# Patient Record
Sex: Female | Born: 1995 | Race: White | Hispanic: No | Marital: Single | Smoking: Never smoker
Health system: Southern US, Community
[De-identification: ages and names within clinical notes are randomized; demographics above are authoritative.]

## PROBLEM LIST (undated history)

## (undated) DIAGNOSIS — F909 Attention-deficit hyperactivity disorder, unspecified type: Secondary | ICD-10-CM

## (undated) DIAGNOSIS — Z8669 Personal history of other diseases of the nervous system and sense organs: Secondary | ICD-10-CM

## (undated) DIAGNOSIS — Z803 Family history of malignant neoplasm of breast: Secondary | ICD-10-CM

## (undated) HISTORY — PX: WISDOM TOOTH EXTRACTION: SHX21

## (undated) HISTORY — DX: Family history of malignant neoplasm of breast: Z80.3

---

## 2017-10-01 DIAGNOSIS — H9203 Otalgia, bilateral: Secondary | ICD-10-CM | POA: Diagnosis present

## 2017-10-01 DIAGNOSIS — H6693 Otitis media, unspecified, bilateral: Secondary | ICD-10-CM | POA: Diagnosis not present

## 2017-10-02 ENCOUNTER — Emergency Department (HOSPITAL_COMMUNITY)
Admission: EM | Admit: 2017-10-02 | Discharge: 2017-10-02 | Disposition: A | Discharge status: Home / Self Care | Payer: BLUE CROSS/BLUE SHIELD | Attending: Emergency Medicine | Admitting: Emergency Medicine

## 2017-10-02 ENCOUNTER — Encounter (HOSPITAL_COMMUNITY): Payer: Self-pay | Admitting: Family Medicine

## 2017-10-02 DIAGNOSIS — H6690 Otitis media, unspecified, unspecified ear: Secondary | ICD-10-CM

## 2017-10-02 HISTORY — DX: Personal history of other diseases of the nervous system and sense organs: Z86.69

## 2017-10-02 MED ORDER — AMOXICILLIN-POT CLAVULANATE 875-125 MG PO TABS: 1.0000 | ORAL_TABLET | Freq: Two times a day (BID) | ORAL | 0 refills | Status: AC

## 2017-10-02 MED ORDER — IBUPROFEN 200 MG PO TABS
600.0000 mg | ORAL_TABLET | Freq: Once | ORAL | Status: AC
  Administered 2017-10-02: 600 mg via ORAL
  Filled 2017-10-02: qty 3

## 2017-10-02 NOTE — ED Provider Notes (Signed)
[facility]-EMERGENCY DEPT Provider Note   CSN: [id number] Arrival date & time: 10/01/17  2235     History   Chief Complaint Chief Complaint  Patient presents with  . Otalgia    HPI Kelly Goodman is a 22 y.o. female.  HPI 22 year old female with history of recurrent ear infections presents to the emergency department today with complaints of bilateral otalgia.  States that over the past week she has had an upper respiratory tract infection including sinus congestion, rhinorrhea, sore throat.  Patient reports that her other symptoms have improved however her otalgia has persisted.  Left greater than right.  She reports ear fullness and popping.  She denies any known fevers.  Does report history of ear infections does require antibiotics in the past.  As she is been taking over-the-counter Sudafed with only little relief.  Nothing makes her symptoms better or worse.  She took Tylenol prior to arrival but has not taken any Motrin.  Denies any other associated symptoms. Past Medical History:  Diagnosis Date  . History of ear infections     There are no active problems to display for this patient.   Past Surgical History:  Procedure Laterality Date  . WISDOM TOOTH EXTRACTION      OB History    No data available       Home Medications    Prior to Admission medications   Medication Sig Start Date End Date Taking? Authorizing Provider  amoxicillin-clavulanate (AUGMENTIN) 875-125 MG tablet Take 1 tablet by mouth every 12 (twelve) hours. 10/02/17   Rise MuLeaphart, Sekai Gitlin T, PA-C    Family History History reviewed. No pertinent family history.  Social History Social History   Tobacco Use  . Smoking status: Never Smoker  . Smokeless tobacco: Never Used  Substance Use Topics  . Alcohol use: Yes    Comment: 1-2 drinks a night   . Drug use: No     Allergies   Patient has no allergy information on record.   Review of Systems Review of Systems  All  other systems reviewed and are negative.    Physical Exam Updated Vital Signs BP 126/79 (BP Location: Left Arm)   Pulse 79   Temp 97.8 F (36.6 C) (Oral)   Resp 20   Ht 5\' 6"  (1.676 m)   Wt 68 kg (150 lb)   LMP 09/21/2017   SpO2 99%   BMI 24.21 kg/m   Physical Exam  Constitutional: She appears well-developed and well-nourished. No distress.  HENT:  Head: Normocephalic and atraumatic.  Right Ear: Hearing, external ear and ear canal normal. No mastoid tenderness. Tympanic membrane is erythematous and retracted. A middle ear effusion is present.  Left Ear: Hearing, external ear and ear canal normal. No mastoid tenderness. Tympanic membrane is erythematous and bulging. A middle ear effusion is present.  Nose: Nose normal.  Mouth/Throat: Oropharynx is clear and moist. No oropharyngeal exudate.  Eyes: Conjunctivae are normal. Right eye exhibits no discharge. Left eye exhibits no discharge. No scleral icterus.  Neck: Normal range of motion. Neck supple.  Pulmonary/Chest: No respiratory distress.  Musculoskeletal: Normal range of motion.  Lymphadenopathy:    She has no cervical adenopathy.  Neurological: She is alert.  Skin: No pallor.  Psychiatric: Her behavior is normal. Judgment and thought content normal.  Nursing note and vitals reviewed.    ED Treatments / Results  Labs (all labs ordered are listed, but only abnormal results are displayed) Labs Reviewed - No data to  display  EKG  EKG Interpretation None       Radiology No results found.  Procedures Procedures (including critical care time)  Medications Ordered in ED Medications  ibuprofen (ADVIL,MOTRIN) tablet 600 mg (not administered)     Initial Impression / Assessment and Plan / ED Course  I have reviewed the triage vital signs and the nursing notes.  Pertinent labs & imaging results that were available during my care of the patient were reviewed by me and considered in my medical decision making  (see chart for details).     Patient presents with otalgia and exam consistent with acute otitis media. No concern for acute mastoiditis, meningitis.  Patient started on Augmentin due to recurrent ear infections and physical exam.  Have given ENT follow-up if symptoms not improved. Pt is hemodynamically stable, in NAD, & able to ambulate in the ED. Evaluation does not show pathology that would require ongoing emergent intervention or inpatient treatment. I explained the diagnosis to the patient. Pain has been managed & has no complaints prior to dc. Pt is comfortable with above plan and is stable for discharge at this time. All questions were answered prior to disposition. Strict return precautions for f/u to the ED were discussed. Encouraged follow up with PCP.     Final Clinical Impressions(s) / ED Diagnoses   Final diagnoses:  Acute otitis media, unspecified otitis media type    ED Discharge Orders        Ordered    amoxicillin-clavulanate (AUGMENTIN) 875-125 MG tablet  Every 12 hours     10/02/17 0032       Rise Mu, PA-C 10/02/17 0037    Dione Booze, MD 10/02/17 [phone]

## 2017-10-02 NOTE — ED Notes (Signed)
Bed: WA07 Expected date:  Expected time:  Means of arrival:  Comments: 

## 2017-10-02 NOTE — Discharge Instructions (Signed)
You do have signs of an inner ear infection.  Take antibiotics as prescribed.  May use over-the-counter Flonase.  Motrin Tylenol for pain and fevers.  Have given you ENT follow-up if symptoms do not improve return the ED with any worsening symptoms.

## 2017-10-02 NOTE — ED Triage Notes (Signed)
Patient reports she had cold like symptoms last week. Over the weekend she developed ear aches but got worse tonight. Denies fever. Patient reports she has had multiple ear infections in the past.

## 2017-10-26 DIAGNOSIS — F909 Attention-deficit hyperactivity disorder, unspecified type: Secondary | ICD-10-CM | POA: Diagnosis not present

## 2018-01-15 DIAGNOSIS — K219 Gastro-esophageal reflux disease without esophagitis: Secondary | ICD-10-CM | POA: Diagnosis not present

## 2018-01-15 DIAGNOSIS — F900 Attention-deficit hyperactivity disorder, predominantly inattentive type: Secondary | ICD-10-CM | POA: Diagnosis not present

## 2018-01-15 DIAGNOSIS — Z9109 Other allergy status, other than to drugs and biological substances: Secondary | ICD-10-CM | POA: Diagnosis not present

## 2018-01-15 DIAGNOSIS — Z3009 Encounter for other general counseling and advice on contraception: Secondary | ICD-10-CM | POA: Diagnosis not present

## 2018-03-07 DIAGNOSIS — Z23 Encounter for immunization: Secondary | ICD-10-CM | POA: Diagnosis not present

## 2018-08-05 DIAGNOSIS — Z0000 Encounter for general adult medical examination without abnormal findings: Secondary | ICD-10-CM | POA: Diagnosis not present

## 2018-08-05 DIAGNOSIS — F900 Attention-deficit hyperactivity disorder, predominantly inattentive type: Secondary | ICD-10-CM | POA: Diagnosis not present

## 2018-08-05 DIAGNOSIS — Z13220 Encounter for screening for lipoid disorders: Secondary | ICD-10-CM | POA: Diagnosis not present

## 2018-09-17 DIAGNOSIS — H5789 Other specified disorders of eye and adnexa: Secondary | ICD-10-CM | POA: Diagnosis not present

## 2019-02-03 DIAGNOSIS — F900 Attention-deficit hyperactivity disorder, predominantly inattentive type: Secondary | ICD-10-CM | POA: Diagnosis not present

## 2019-03-13 DIAGNOSIS — Z01419 Encounter for gynecological examination (general) (routine) without abnormal findings: Secondary | ICD-10-CM | POA: Diagnosis not present

## 2019-03-14 ENCOUNTER — Other Ambulatory Visit: Payer: Self-pay | Admitting: Obstetrics & Gynecology

## 2019-03-14 DIAGNOSIS — N63 Unspecified lump in unspecified breast: Secondary | ICD-10-CM

## 2019-03-21 ENCOUNTER — Ambulatory Visit
Admission: RE | Admit: 2019-03-21 | Discharge: 2019-03-21 | Disposition: A | Discharge status: Home / Self Care | Payer: BC Managed Care – PPO | Source: Ambulatory Visit | Attending: Obstetrics & Gynecology | Admitting: Obstetrics & Gynecology

## 2019-03-21 ENCOUNTER — Other Ambulatory Visit: Payer: Self-pay

## 2019-03-21 DIAGNOSIS — N6489 Other specified disorders of breast: Secondary | ICD-10-CM | POA: Diagnosis not present

## 2019-03-21 DIAGNOSIS — N63 Unspecified lump in unspecified breast: Secondary | ICD-10-CM

## 2019-03-24 DIAGNOSIS — Z20828 Contact with and (suspected) exposure to other viral communicable diseases: Secondary | ICD-10-CM | POA: Diagnosis not present

## 2019-03-25 DIAGNOSIS — L0390 Cellulitis, unspecified: Secondary | ICD-10-CM | POA: Diagnosis not present

## 2019-03-25 DIAGNOSIS — T63461A Toxic effect of venom of wasps, accidental (unintentional), initial encounter: Secondary | ICD-10-CM | POA: Diagnosis not present

## 2019-03-26 DIAGNOSIS — L0390 Cellulitis, unspecified: Secondary | ICD-10-CM | POA: Diagnosis not present

## 2019-03-26 DIAGNOSIS — T63461D Toxic effect of venom of wasps, accidental (unintentional), subsequent encounter: Secondary | ICD-10-CM | POA: Diagnosis not present

## 2019-05-26 DIAGNOSIS — Z20828 Contact with and (suspected) exposure to other viral communicable diseases: Secondary | ICD-10-CM | POA: Diagnosis not present

## 2019-06-09 DIAGNOSIS — Z20828 Contact with and (suspected) exposure to other viral communicable diseases: Secondary | ICD-10-CM | POA: Diagnosis not present

## 2019-08-12 DIAGNOSIS — F900 Attention-deficit hyperactivity disorder, predominantly inattentive type: Secondary | ICD-10-CM | POA: Diagnosis not present

## 2019-08-12 DIAGNOSIS — Z0000 Encounter for general adult medical examination without abnormal findings: Secondary | ICD-10-CM | POA: Diagnosis not present

## 2019-08-12 DIAGNOSIS — R739 Hyperglycemia, unspecified: Secondary | ICD-10-CM | POA: Diagnosis not present

## 2019-08-21 DIAGNOSIS — Z0000 Encounter for general adult medical examination without abnormal findings: Secondary | ICD-10-CM | POA: Diagnosis not present

## 2019-08-21 DIAGNOSIS — R739 Hyperglycemia, unspecified: Secondary | ICD-10-CM | POA: Diagnosis not present

## 2019-08-21 DIAGNOSIS — Z13220 Encounter for screening for lipoid disorders: Secondary | ICD-10-CM | POA: Diagnosis not present

## 2019-09-30 DIAGNOSIS — Z20828 Contact with and (suspected) exposure to other viral communicable diseases: Secondary | ICD-10-CM | POA: Diagnosis not present

## 2019-10-30 DIAGNOSIS — Z23 Encounter for immunization: Secondary | ICD-10-CM | POA: Diagnosis not present

## 2020-01-21 DIAGNOSIS — F900 Attention-deficit hyperactivity disorder, predominantly inattentive type: Secondary | ICD-10-CM | POA: Diagnosis not present

## 2020-01-21 DIAGNOSIS — T63461A Toxic effect of venom of wasps, accidental (unintentional), initial encounter: Secondary | ICD-10-CM | POA: Diagnosis not present

## 2020-03-05 DIAGNOSIS — Z20822 Contact with and (suspected) exposure to covid-19: Secondary | ICD-10-CM | POA: Diagnosis not present

## 2020-03-07 IMAGING — US ULTRASOUND RIGHT BREAST LIMITED
1 series · 2 of 2 positions shown · non-contrast
Comparison: None.

CLINICAL DATA: Patient's referring clinician reports a palpable
lump in the upper right breast. Patient's mother was diagnosed with
breast carcinoma around age 40.

EXAM:
ULTRASOUND OF THE RIGHT BREAST

[Series 1: ultrasound right breast limited · 0.07mm/px · 2 of 2 slices shown]
[im 1/2]
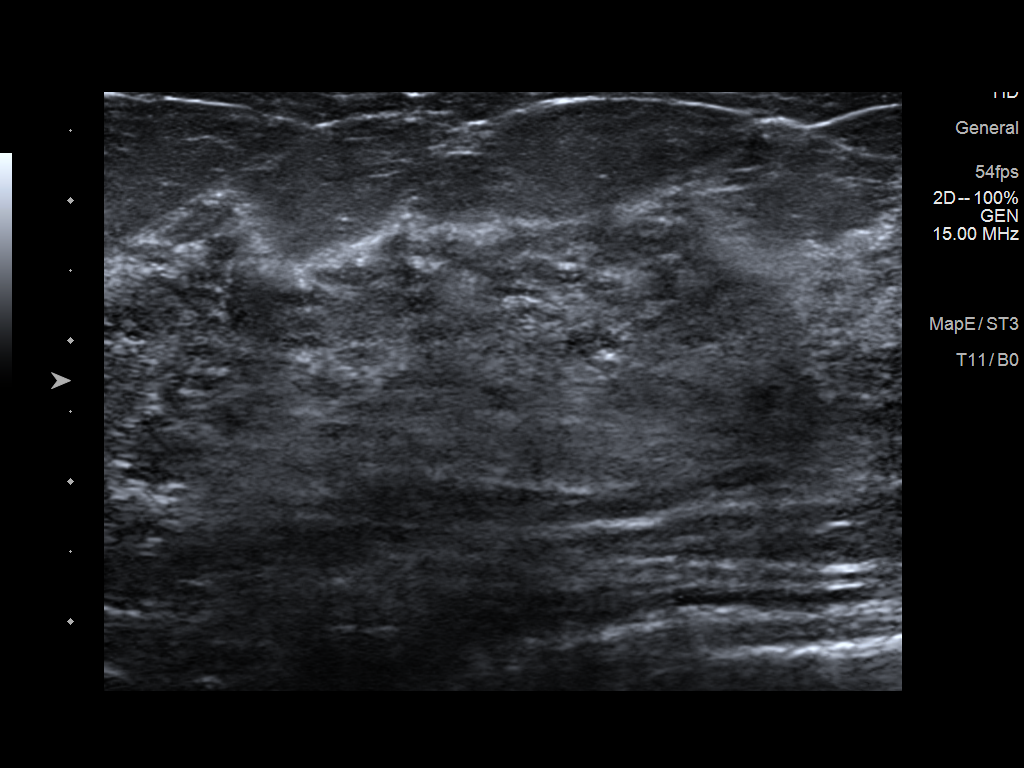
[im 2/2]
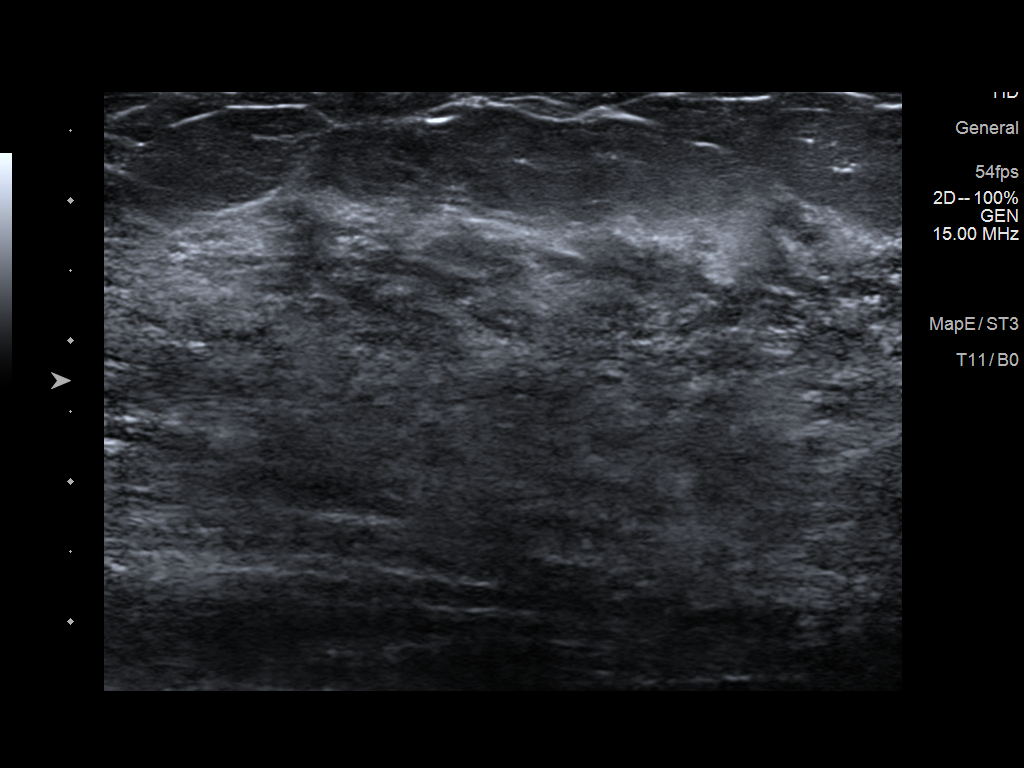

[2 of 2 positions shown; findings below may reference images not displayed]

FINDINGS: On physical exam, no mass is palpated in the upper right breast.

Targeted ultrasound is performed, showing heterogeneous
fibroglandular tissue throughout the upper right breast. No mass or
suspicious lesion.
IMPRESSION: Negative exam.  No evidence of breast malignancy.

RECOMMENDATION:
1. Recommend screening mammography beginning 10 years prior to the
age at which the patient's mother was diagnosed with breast
carcinoma.

I have discussed the findings and recommendations with the patient.
Results were also provided in writing at the conclusion of the
visit. If applicable, a reminder letter will be sent to the patient
regarding the next appointment.

BI-RADS CATEGORY  1: Negative.

## 2020-03-19 DIAGNOSIS — Z01419 Encounter for gynecological examination (general) (routine) without abnormal findings: Secondary | ICD-10-CM | POA: Diagnosis not present

## 2021-01-04 DIAGNOSIS — F900 Attention-deficit hyperactivity disorder, predominantly inattentive type: Secondary | ICD-10-CM | POA: Diagnosis not present

## 2021-04-11 DIAGNOSIS — Z01419 Encounter for gynecological examination (general) (routine) without abnormal findings: Secondary | ICD-10-CM | POA: Diagnosis not present

## 2021-05-02 DIAGNOSIS — Z803 Family history of malignant neoplasm of breast: Secondary | ICD-10-CM | POA: Insufficient documentation

## 2021-05-17 DIAGNOSIS — Z1379 Encounter for other screening for genetic and chromosomal anomalies: Secondary | ICD-10-CM

## 2021-05-17 DIAGNOSIS — Z803 Family history of malignant neoplasm of breast: Secondary | ICD-10-CM

## 2021-06-28 DIAGNOSIS — F900 Attention-deficit hyperactivity disorder, predominantly inattentive type: Secondary | ICD-10-CM | POA: Diagnosis not present

## 2021-06-28 DIAGNOSIS — Z23 Encounter for immunization: Secondary | ICD-10-CM | POA: Diagnosis not present

## 2021-10-12 DIAGNOSIS — Z3201 Encounter for pregnancy test, result positive: Secondary | ICD-10-CM | POA: Diagnosis not present

## 2021-10-27 DIAGNOSIS — O26891 Other specified pregnancy related conditions, first trimester: Secondary | ICD-10-CM | POA: Diagnosis not present

## 2021-11-02 DIAGNOSIS — Z124 Encounter for screening for malignant neoplasm of cervix: Secondary | ICD-10-CM | POA: Diagnosis not present

## 2021-11-02 DIAGNOSIS — Z3401 Encounter for supervision of normal first pregnancy, first trimester: Secondary | ICD-10-CM | POA: Diagnosis not present

## 2021-11-16 DIAGNOSIS — Z3481 Encounter for supervision of other normal pregnancy, first trimester: Secondary | ICD-10-CM | POA: Diagnosis not present

## 2021-11-16 DIAGNOSIS — Z3401 Encounter for supervision of normal first pregnancy, first trimester: Secondary | ICD-10-CM | POA: Diagnosis not present

## 2022-01-05 DIAGNOSIS — F900 Attention-deficit hyperactivity disorder, predominantly inattentive type: Secondary | ICD-10-CM | POA: Diagnosis not present

## 2022-01-25 DIAGNOSIS — M5489 Other dorsalgia: Secondary | ICD-10-CM | POA: Diagnosis not present

## 2022-01-26 DIAGNOSIS — Z36 Encounter for antenatal screening for chromosomal anomalies: Secondary | ICD-10-CM | POA: Diagnosis not present

## 2022-01-26 DIAGNOSIS — Z3402 Encounter for supervision of normal first pregnancy, second trimester: Secondary | ICD-10-CM | POA: Diagnosis not present

## 2022-01-27 DIAGNOSIS — Z3490 Encounter for supervision of normal pregnancy, unspecified, unspecified trimester: Secondary | ICD-10-CM | POA: Diagnosis not present

## 2022-01-27 DIAGNOSIS — Z3402 Encounter for supervision of normal first pregnancy, second trimester: Secondary | ICD-10-CM | POA: Diagnosis not present

## 2022-02-23 DIAGNOSIS — Z362 Encounter for other antenatal screening follow-up: Secondary | ICD-10-CM | POA: Diagnosis not present

## 2022-02-23 DIAGNOSIS — Z3490 Encounter for supervision of normal pregnancy, unspecified, unspecified trimester: Secondary | ICD-10-CM | POA: Diagnosis not present

## 2022-02-23 DIAGNOSIS — O4442 Low lying placenta NOS or without hemorrhage, second trimester: Secondary | ICD-10-CM | POA: Diagnosis not present

## 2022-02-23 DIAGNOSIS — Z3402 Encounter for supervision of normal first pregnancy, second trimester: Secondary | ICD-10-CM | POA: Diagnosis not present

## 2022-02-28 DIAGNOSIS — O99810 Abnormal glucose complicating pregnancy: Secondary | ICD-10-CM | POA: Diagnosis not present

## 2022-03-28 DIAGNOSIS — Z23 Encounter for immunization: Secondary | ICD-10-CM | POA: Diagnosis not present

## 2022-04-05 DIAGNOSIS — O26843 Uterine size-date discrepancy, third trimester: Secondary | ICD-10-CM | POA: Diagnosis not present

## 2022-04-27 DIAGNOSIS — O26843 Uterine size-date discrepancy, third trimester: Secondary | ICD-10-CM | POA: Diagnosis not present

## 2022-05-11 DIAGNOSIS — Z3490 Encounter for supervision of normal pregnancy, unspecified, unspecified trimester: Secondary | ICD-10-CM | POA: Diagnosis not present

## 2022-05-11 DIAGNOSIS — Z3403 Encounter for supervision of normal first pregnancy, third trimester: Secondary | ICD-10-CM | POA: Diagnosis not present

## 2022-05-25 DIAGNOSIS — O26843 Uterine size-date discrepancy, third trimester: Secondary | ICD-10-CM | POA: Diagnosis not present

## 2022-05-29 DIAGNOSIS — A6000 Herpesviral infection of urogenital system, unspecified: Secondary | ICD-10-CM | POA: Diagnosis present

## 2022-05-29 DIAGNOSIS — Z3A38 38 weeks gestation of pregnancy: Secondary | ICD-10-CM

## 2022-05-29 DIAGNOSIS — O9832 Other infections with a predominantly sexual mode of transmission complicating childbirth: Secondary | ICD-10-CM | POA: Diagnosis present

## 2022-05-29 DIAGNOSIS — O134 Gestational [pregnancy-induced] hypertension without significant proteinuria, complicating childbirth: Principal | ICD-10-CM | POA: Diagnosis present

## 2022-05-29 DIAGNOSIS — O26893 Other specified pregnancy related conditions, third trimester: Secondary | ICD-10-CM | POA: Diagnosis present

## 2022-05-29 HISTORY — DX: Attention-deficit hyperactivity disorder, unspecified type: F90.9

## 2022-05-29 MED ADMIN — Fentanyl Citrate Preservative Free (PF) Inj 100 MCG/2ML: 100 ug | INTRAVENOUS | NDC 72572017001

## 2022-05-29 MED ADMIN — Lidocaine HCl Local Preservative Free (PF) Inj 1%: 30 mL | SUBCUTANEOUS | NDC 63323049207

## 2022-05-29 MED ADMIN — Oxytocin-Sodium Chloride 0.9% IV Soln 30 Unit/500ML: 2 m[IU]/min | INTRAVENOUS | NDC 99999070057

## 2022-05-29 MED ADMIN — Ondansetron HCl Inj 4 MG/2ML (2 MG/ML): 4 mg | INTRAVENOUS | NDC 60505613000

## 2022-05-29 MED ADMIN — Lidocaine Inj 2% w/ Epinephrine-1:200000 (PF): 5 mL | EPIDURAL | NDC 00409318301

## 2022-05-29 MED ADMIN — Misoprostol Tab 200 MCG: 800 ug | RECTAL | NDC 59762500801

## 2022-05-29 MED ADMIN — Methylergonovine Maleate Inj 0.2 MG/ML: 0.2 mg | INTRAMUSCULAR | NDC 00517074001

## 2022-05-29 MED ADMIN — Tranexamic Acid-Sodium Chloride IV Soln 1000 MG/100ML-0.7%: 1000 mg | INTRAVENOUS | NDC 51754010801

## 2022-05-29 MED ADMIN — Sodium Citrate & Citric Acid Soln 500-334 MG/5ML: 30 mL | ORAL | NDC 00121119030

## 2022-05-29 MED ADMIN — Fentanyl 0.5 MG/250ML-Bupiv 0.125%-NaCl 0.9% Epidural Inj: 12 mL/h | EPIDURAL | NDC 70004023140

## 2022-05-29 MED ADMIN — Oxytocin Inj 10 Unit/ML: 333 mL | INTRAVENOUS | NDC 99999070057

## 2022-05-30 MED ADMIN — Ibuprofen Tab 600 MG: 600 mg | ORAL | NDC 00904585461

## 2022-05-30 MED ADMIN — Witch Hazel-Glycerin Cleansing Pads: 1 | TOPICAL | NDC 50289325001

## 2022-05-30 MED ADMIN — Acetaminophen Tab 325 MG: 650 mg | ORAL | NDC 50580045811

## 2022-05-30 MED ADMIN — Valacyclovir HCl Tab 500 MG: 500 mg | ORAL | NDC 57237004290

## 2022-05-30 MED ADMIN — Valacyclovir HCl Tab 500 MG: 500 mg | ORAL | NDC 63739007710

## 2022-05-30 MED ADMIN — PRENATAL PLUS 27-1 MG PO TABS: 1 | ORAL | NDC 39328010610

## 2022-05-30 MED ADMIN — Sennosides-Docusate Sodium Tab 8.6-50 MG: 2 | ORAL | NDC 00536124710

## 2022-05-30 MED ADMIN — Oxycodone HCl Tab 5 MG: 5 mg | ORAL | NDC 00406055223

## 2022-05-30 MED ADMIN — Oxycodone HCl Tab 5 MG: 5 mg | ORAL | NDC 42858000110

## 2022-05-30 MED ADMIN — Loratadine Tab 10 MG: 10 mg | ORAL | NDC 00904685261

## 2022-05-30 MED ADMIN — Dibucaine Perianal Ointment 1%: 1 | RECTAL | NDC 45802005003

## 2022-05-30 MED ADMIN — Benzocaine-Menthol Aerosol 20-0.5%: 1 | TOPICAL | NDC 1686468003

## 2022-05-31 MED ADMIN — Ibuprofen Tab 600 MG: 600 mg | ORAL | NDC 00904585461

## 2022-05-31 MED ADMIN — Valacyclovir HCl Tab 500 MG: 500 mg | ORAL | NDC 63739007710

## 2022-05-31 MED ADMIN — PRENATAL PLUS 27-1 MG PO TABS: 1 | ORAL | NDC 39328010610

## 2022-05-31 MED ADMIN — Sennosides-Docusate Sodium Tab 8.6-50 MG: 2 | ORAL | NDC 00536124710

## 2022-05-31 MED ADMIN — Oxycodone HCl Tab 5 MG: 5 mg | ORAL | NDC 42858000110

## 2022-05-31 MED ADMIN — Oxycodone HCl Tab 5 MG: 5 mg | ORAL | NDC 00406055223

## 2022-05-31 MED ADMIN — Loratadine Tab 10 MG: 10 mg | ORAL | NDC 16571082201

## 2022-07-11 DIAGNOSIS — F900 Attention-deficit hyperactivity disorder, predominantly inattentive type: Secondary | ICD-10-CM | POA: Diagnosis not present

## 2022-08-28 DIAGNOSIS — N9089 Other specified noninflammatory disorders of vulva and perineum: Secondary | ICD-10-CM | POA: Diagnosis not present

## 2022-08-28 DIAGNOSIS — N895 Stricture and atresia of vagina: Secondary | ICD-10-CM | POA: Diagnosis not present

## 2022-09-18 DIAGNOSIS — N895 Stricture and atresia of vagina: Secondary | ICD-10-CM | POA: Diagnosis not present

## 2022-09-18 DIAGNOSIS — N9089 Other specified noninflammatory disorders of vulva and perineum: Secondary | ICD-10-CM | POA: Diagnosis not present

## 2023-01-15 DIAGNOSIS — F900 Attention-deficit hyperactivity disorder, predominantly inattentive type: Secondary | ICD-10-CM | POA: Diagnosis not present

## 2023-03-01 DIAGNOSIS — M25561 Pain in right knee: Secondary | ICD-10-CM | POA: Diagnosis not present

## 2023-07-30 DIAGNOSIS — F900 Attention-deficit hyperactivity disorder, predominantly inattentive type: Secondary | ICD-10-CM | POA: Diagnosis not present

## 2023-07-30 DIAGNOSIS — R0602 Shortness of breath: Secondary | ICD-10-CM | POA: Diagnosis not present

## 2023-07-30 DIAGNOSIS — E782 Mixed hyperlipidemia: Secondary | ICD-10-CM | POA: Diagnosis not present

## 2023-07-30 DIAGNOSIS — K219 Gastro-esophageal reflux disease without esophagitis: Secondary | ICD-10-CM | POA: Diagnosis not present

## 2023-07-30 DIAGNOSIS — Z0000 Encounter for general adult medical examination without abnormal findings: Secondary | ICD-10-CM | POA: Diagnosis not present

## 2023-07-31 DIAGNOSIS — Z3041 Encounter for surveillance of contraceptive pills: Secondary | ICD-10-CM | POA: Diagnosis not present

## 2023-07-31 DIAGNOSIS — F32A Depression, unspecified: Secondary | ICD-10-CM | POA: Diagnosis not present

## 2023-12-07 DIAGNOSIS — H66001 Acute suppurative otitis media without spontaneous rupture of ear drum, right ear: Secondary | ICD-10-CM | POA: Diagnosis not present

## 2023-12-07 DIAGNOSIS — H9201 Otalgia, right ear: Secondary | ICD-10-CM | POA: Diagnosis not present

## 2024-01-31 DIAGNOSIS — F4320 Adjustment disorder, unspecified: Secondary | ICD-10-CM | POA: Diagnosis not present

## 2024-02-06 DIAGNOSIS — F900 Attention-deficit hyperactivity disorder, predominantly inattentive type: Secondary | ICD-10-CM | POA: Diagnosis not present

## 2024-02-06 DIAGNOSIS — M542 Cervicalgia: Secondary | ICD-10-CM | POA: Diagnosis not present

## 2024-02-06 DIAGNOSIS — F32A Depression, unspecified: Secondary | ICD-10-CM | POA: Diagnosis not present

## 2024-02-14 DIAGNOSIS — F4320 Adjustment disorder, unspecified: Secondary | ICD-10-CM | POA: Diagnosis not present

## 2024-02-28 DIAGNOSIS — F4320 Adjustment disorder, unspecified: Secondary | ICD-10-CM | POA: Diagnosis not present

## 2024-03-11 DIAGNOSIS — L2089 Other atopic dermatitis: Secondary | ICD-10-CM | POA: Diagnosis not present

## 2024-03-13 DIAGNOSIS — F4320 Adjustment disorder, unspecified: Secondary | ICD-10-CM | POA: Diagnosis not present

## 2024-04-11 DIAGNOSIS — F4320 Adjustment disorder, unspecified: Secondary | ICD-10-CM | POA: Diagnosis not present

## 2024-05-02 DIAGNOSIS — F4320 Adjustment disorder, unspecified: Secondary | ICD-10-CM | POA: Diagnosis not present
